# Patient Record
Sex: Male | Born: 1975 | Race: White | Marital: Married | Smoking: Current every day smoker
Health system: Southern US, Community
[De-identification: ages and names within clinical notes are randomized; demographics above are authoritative.]

## PROBLEM LIST (undated history)

## (undated) HISTORY — PX: BACK SURGERY: SHX140

## (undated) HISTORY — PX: FOOT SURGERY: SHX648

---

## 2015-01-26 ENCOUNTER — Encounter: Payer: Self-pay | Admitting: Emergency Medicine

## 2015-01-26 ENCOUNTER — Emergency Department: Payer: Commercial Managed Care - HMO

## 2015-01-26 ENCOUNTER — Inpatient Hospital Stay
Admission: EM | Admit: 2015-01-26 | Discharge: 2015-01-28 | DRG: 201 | Disposition: A | Discharge status: Home / Self Care | Payer: Commercial Managed Care - HMO | Attending: Surgery | Admitting: Surgery

## 2015-01-26 DIAGNOSIS — Z885 Allergy status to narcotic agent status: Secondary | ICD-10-CM

## 2015-01-26 DIAGNOSIS — J939 Pneumothorax, unspecified: Secondary | ICD-10-CM | POA: Diagnosis not present

## 2015-01-26 DIAGNOSIS — Z4682 Encounter for fitting and adjustment of non-vascular catheter: Secondary | ICD-10-CM

## 2015-01-26 DIAGNOSIS — F17210 Nicotine dependence, cigarettes, uncomplicated: Secondary | ICD-10-CM | POA: Diagnosis present

## 2015-01-26 DIAGNOSIS — Z9689 Presence of other specified functional implants: Secondary | ICD-10-CM

## 2015-01-26 DIAGNOSIS — R079 Chest pain, unspecified: Secondary | ICD-10-CM

## 2015-01-26 LAB — BASIC METABOLIC PANEL
ANION GAP: 10 (ref 5–15)
BUN: 9 mg/dL (ref 6–20)
CALCIUM: 9.9 mg/dL (ref 8.9–10.3)
CO2: 25 mmol/L (ref 22–32)
Chloride: 105 mmol/L (ref 101–111)
Creatinine, Ser: 0.84 mg/dL (ref 0.61–1.24)
Glucose, Bld: 97 mg/dL (ref 65–99)
Potassium: 3.8 mmol/L (ref 3.5–5.1)
Sodium: 140 mmol/L (ref 135–145)

## 2015-01-26 LAB — CBC
HCT: 47 % (ref 40.0–52.0)
HEMOGLOBIN: 15.9 g/dL (ref 13.0–18.0)
MCH: 30.8 pg (ref 26.0–34.0)
MCHC: 33.8 g/dL (ref 32.0–36.0)
MCV: 91.1 fL (ref 80.0–100.0)
Platelets: 252 10*3/uL (ref 150–440)
RBC: 5.16 MIL/uL (ref 4.40–5.90)
RDW: 13.3 % (ref 11.5–14.5)
WBC: 21.2 10*3/uL — AB (ref 3.8–10.6)

## 2015-01-26 LAB — TROPONIN I

## 2015-01-26 MED ORDER — DIPHENHYDRAMINE HCL 12.5 MG/5ML PO ELIX: 12.5000 mg | ORAL_SOLUTION | Freq: Four times a day (QID) | ORAL | Status: DC | PRN

## 2015-01-26 MED ORDER — HYDROMORPHONE HCL 1 MG/ML IJ SOLN
0.5000 mg | Freq: Once | INTRAMUSCULAR | Status: AC
  Administered 2015-01-26: 0.5 mg via INTRAVENOUS
  Filled 2015-01-26: qty 1

## 2015-01-26 MED ORDER — ETOMIDATE 2 MG/ML IV SOLN
INTRAVENOUS | Status: AC | PRN
  Administered 2015-01-26 (×2): 5 mg via INTRAVENOUS
  Administered 2015-01-26: 10 mg via INTRAVENOUS

## 2015-01-26 MED ORDER — OXYCODONE HCL 5 MG PO TABS
5.0000 mg | ORAL_TABLET | ORAL | Status: DC | PRN
  Administered 2015-01-26 – 2015-01-28 (×7): 5 mg via ORAL
  Filled 2015-01-26 (×9): qty 1

## 2015-01-26 MED ORDER — ONDANSETRON HCL 4 MG/2ML IJ SOLN
4.0000 mg | Freq: Four times a day (QID) | INTRAMUSCULAR | Status: DC | PRN
  Administered 2015-01-26: 4 mg via INTRAVENOUS
  Filled 2015-01-26: qty 2

## 2015-01-26 MED ORDER — ETOMIDATE 2 MG/ML IV SOLN
INTRAVENOUS | Status: AC
  Filled 2015-01-26: qty 10

## 2015-01-26 MED ORDER — HYDROMORPHONE HCL 1 MG/ML IJ SOLN
0.5000 mg | Freq: Once | INTRAMUSCULAR | Status: AC
  Administered 2015-01-26: 0.5 mg via INTRAVENOUS

## 2015-01-26 MED ORDER — ONDANSETRON HCL 4 MG PO TABS
4.0000 mg | ORAL_TABLET | Freq: Four times a day (QID) | ORAL | Status: DC | PRN
  Administered 2015-01-28: 4 mg via ORAL
  Filled 2015-01-26: qty 1

## 2015-01-26 MED ORDER — SODIUM CHLORIDE 0.9 % IJ SOLN: 9.0000 mL | INTRAMUSCULAR | Status: DC | PRN

## 2015-01-26 MED ORDER — HYDROMORPHONE HCL 1 MG/ML IJ SOLN
INTRAMUSCULAR | Status: AC | PRN
  Administered 2015-01-26: 1 mg via INTRAVENOUS

## 2015-01-26 MED ORDER — LORAZEPAM 2 MG/ML IJ SOLN
0.5000 mg | INTRAMUSCULAR | Status: DC | PRN
  Administered 2015-01-26 – 2015-01-28 (×7): 0.5 mg via INTRAVENOUS
  Filled 2015-01-26 (×8): qty 1

## 2015-01-26 MED ORDER — PROMETHAZINE HCL 25 MG PO TABS
25.0000 mg | ORAL_TABLET | Freq: Four times a day (QID) | ORAL | Status: DC | PRN
  Administered 2015-01-27: 25 mg via ORAL
  Filled 2015-01-26: qty 1

## 2015-01-26 MED ORDER — SODIUM CHLORIDE 0.9 % IJ SOLN: 3.0000 mL | INTRAMUSCULAR | Status: DC | PRN

## 2015-01-26 MED ORDER — HYDROMORPHONE HCL 1 MG/ML IJ SOLN
1.0000 mg | Freq: Once | INTRAMUSCULAR | Status: AC
  Administered 2015-01-26: 1 mg via INTRAVENOUS
  Filled 2015-01-26: qty 1

## 2015-01-26 MED ORDER — DIPHENHYDRAMINE HCL 50 MG/ML IJ SOLN
12.5000 mg | Freq: Four times a day (QID) | INTRAMUSCULAR | Status: DC | PRN
  Administered 2015-01-26: 12.5 mg via INTRAVENOUS
  Filled 2015-01-26: qty 1

## 2015-01-26 MED ORDER — ONDANSETRON HCL 4 MG/2ML IJ SOLN: 4.0000 mg | Freq: Four times a day (QID) | INTRAMUSCULAR | Status: DC | PRN

## 2015-01-26 MED ORDER — LIDOCAINE-EPINEPHRINE (PF) 1 %-1:200000 IJ SOLN
INTRAMUSCULAR | Status: AC
  Filled 2015-01-26: qty 30

## 2015-01-26 MED ORDER — ONDANSETRON HCL 4 MG/2ML IJ SOLN
4.0000 mg | Freq: Once | INTRAMUSCULAR | Status: AC
  Administered 2015-01-26: 4 mg via INTRAVENOUS
  Filled 2015-01-26: qty 2

## 2015-01-26 MED ORDER — HYDROMORPHONE HCL 1 MG/ML IJ SOLN
1.0000 mg | INTRAMUSCULAR | Status: DC | PRN
Start: 2015-01-26 — End: 2015-01-26
  Administered 2015-01-26 (×2): 1 mg via INTRAVENOUS
  Filled 2015-01-26 (×3): qty 1

## 2015-01-26 MED ORDER — HYDROMORPHONE HCL 1 MG/ML IJ SOLN
INTRAMUSCULAR | Status: AC
  Filled 2015-01-26: qty 1

## 2015-01-26 MED ORDER — SODIUM CHLORIDE 0.9 % IJ SOLN
3.0000 mL | Freq: Two times a day (BID) | INTRAMUSCULAR | Status: DC
  Administered 2015-01-28: 3 mL via INTRAVENOUS

## 2015-01-26 MED ORDER — HYDROMORPHONE 0.3 MG/ML IV SOLN
INTRAVENOUS | Status: DC
  Administered 2015-01-26: 21:00:00 via INTRAVENOUS
  Administered 2015-01-27: 1.8 mg via INTRAVENOUS
  Administered 2015-01-27: 09:00:00 via INTRAVENOUS
  Administered 2015-01-27: 1.5 mg via INTRAVENOUS
  Administered 2015-01-27: 2.4 mg via INTRAVENOUS
  Administered 2015-01-28: 2.1 mg via INTRAVENOUS
  Filled 2015-01-26 (×3): qty 25

## 2015-01-26 MED ORDER — LORAZEPAM 2 MG/ML IJ SOLN
INTRAMUSCULAR | Status: AC
  Filled 2015-01-26: qty 1

## 2015-01-26 MED ORDER — NALOXONE HCL 0.4 MG/ML IJ SOLN: 0.4000 mg | INTRAMUSCULAR | Status: DC | PRN

## 2015-01-26 MED ORDER — LORAZEPAM 2 MG/ML IJ SOLN
0.5000 mg | INTRAMUSCULAR | Status: DC
  Administered 2015-01-26: 0.5 mg via INTRAVENOUS

## 2015-01-26 MED ORDER — SODIUM CHLORIDE 0.9 % IV SOLN: 250.0000 mL | INTRAVENOUS | Status: DC | PRN

## 2015-01-26 NOTE — ED Notes (Signed)
Dr cooper at bedside to advance chest tube. Pt continues to co pain. pcxr for placement

## 2015-01-26 NOTE — Progress Notes (Signed)
Dr. Excell Seltzer was contacted via telephone to notify him that the patient was still having significant pain.  An order for dilaudid 0.5mg  was given and medication was administered.  Dr. Excell Seltzer was also contacted and notified that Mr. Cameron Mclaughlin was requesting something to help him relax, as he was very anxious.  An order for ativan 0.5mg  was given via telephone and administered.  Patient is now resting quietly.

## 2015-01-26 NOTE — ED Notes (Signed)
Trachea midline. Pt speaking in complete sentences.

## 2015-01-26 NOTE — ED Provider Notes (Signed)
During chest tube insertion, I performed sedation of the patient with etomidate. Patient required several doses of etomidate to achieve adequate sedation. Initially he was given 10 mg infusion followed by 2 separate 5 mg doses. This supplied adequate sedation for chest tube placement by Dr. Shaune Pollack. I was at the bedside less than 10 minutes for the procedure.  Emily Filbert, MD 01/26/15 [phone]

## 2015-01-26 NOTE — Progress Notes (Signed)
Postprocedure film reviewed personally report is not yet dictated. Chest tube is been advanced appropriately and the lung appears near completely expanded.

## 2015-01-26 NOTE — H&P (Signed)
Cameron Mclaughlin is an 39 y.o. male.    Chief Complaint: Right pneumothorax  HPI: The onset of right chest pain with shortness of breath. Never had an episode like this to before denies hemoptysis or nausea or vomiting A chest tube was placed by Dr. Reita Cliche in the ED and a post procedure film demonstrated the need for advancing the chest tube slightly. Performed by me in the ER. No trauma history  No past medical history on file.  Past Surgical History  Procedure Laterality Date  . Back surgery      No family history on file. Social History:  reports that he has been smoking Cigarettes.  He has been smoking about 1.00 pack per day. He does not have any smokeless tobacco history on file. He reports that he drinks alcohol. His drug history is not on file.  Allergies:  Allergies  Allergen Reactions  . Morphine And Related Hives     (Not in a hospital admission)   Review of Systems  Constitutional: Negative.   HENT: Negative.   Eyes: Negative.   Respiratory: Positive for shortness of breath. Negative for cough, hemoptysis, sputum production and wheezing.   Cardiovascular: Positive for chest pain. Negative for palpitations and orthopnea.  Gastrointestinal: Negative for heartburn and nausea.  Genitourinary: Negative.   Musculoskeletal: Negative.   Skin: Negative.   Neurological: Negative.   Psychiatric/Behavioral: Negative.      Physical Exam:  BP 154/97 mmHg  Pulse 83  Resp 30  Ht 5' 9"  (1.753 m)  Wt 160 lb (72.576 kg)  BMI 23.62 kg/m2  SpO2 97%  Physical Exam  Constitutional: He is oriented to person, place, and time and well-developed, well-nourished, and in no distress.  Right chest tube in place patient appears quite uncomfortable and it prefers to sit straight  HENT:  Head: Normocephalic and atraumatic.  Eyes: No scleral icterus.  Neck: Normal range of motion.  Cardiovascular: Normal rate and regular rhythm.   Pulmonary/Chest: No respiratory distress. He has  wheezes. He has no rales.  Right chest tube in place no air leak when on waterseal  Abdominal: Soft. He exhibits no distension.  Musculoskeletal: He exhibits no edema.  Lymphadenopathy:    He has no cervical adenopathy.  Neurological: He is alert and oriented to person, place, and time.  Skin: Skin is warm and dry.  Psychiatric: Affect normal.        Results for orders placed or performed during the hospital encounter of 01/26/15 (from the past 48 hour(s))  Basic metabolic panel     Status: None   Collection Time: 01/26/15  2:26 PM  Result Value Ref Range   Sodium 140 135 - 145 mmol/L   Potassium 3.8 3.5 - 5.1 mmol/L   Chloride 105 101 - 111 mmol/L   CO2 25 22 - 32 mmol/L   Glucose, Bld 97 65 - 99 mg/dL   BUN 9 6 - 20 mg/dL   Creatinine, Ser 0.84 0.61 - 1.24 mg/dL   Calcium 9.9 8.9 - 10.3 mg/dL   GFR calc non Af Amer >60 >60 mL/min   GFR calc Af Amer >60 >60 mL/min    Comment: (NOTE) The eGFR has been calculated using the CKD EPI equation. This calculation has not been validated in all clinical situations. eGFR's persistently <60 mL/min signify possible Chronic Kidney Disease.    Anion gap 10 5 - 15  CBC     Status: Abnormal   Collection Time: 01/26/15  2:26 PM  Result  Value Ref Range   WBC 21.2 (H) 3.8 - 10.6 K/uL   RBC 5.16 4.40 - 5.90 MIL/uL   Hemoglobin 15.9 13.0 - 18.0 g/dL   HCT 47.0 40.0 - 52.0 %   MCV 91.1 80.0 - 100.0 fL   MCH 30.8 26.0 - 34.0 pg   MCHC 33.8 32.0 - 36.0 g/dL   RDW 13.3 11.5 - 14.5 %   Platelets 252 150 - 440 K/uL  Troponin I     Status: None   Collection Time: 01/26/15  2:26 PM  Result Value Ref Range   Troponin I <0.03 <0.031 ng/mL    Comment:        NO INDICATION OF MYOCARDIAL INJURY.    Dg Chest 1 View  01/26/2015   CLINICAL DATA:  Chest tube insertion for pneumothorax  EXAM: CHEST 1 VIEW  COMPARISON:  Study obtained earlier in the day  FINDINGS: There is now a chest tube on the right. The tip of the chest tube appears to reside  in the pleural space. The side port is in the lateral soft tissues. There is extensive soft tissue air on the right. There is now all only a fairly small apical and lateral pneumothorax. There is an area of focal opacity in the right mid lung which may represent localized hematoma. Lungs elsewhere clear. Heart size and pulmonary vascularity are normal. No adenopathy. No bone lesions.  IMPRESSION: There is only a small residual pneumothorax. Note that the side port of the chest tube is outside of the pleural space with soft tissue air present on the right laterally. There is confluent opacity in the mid right lung which likely represents hematoma. Lungs elsewhere clear.  These results were called by telephone at the time of interpretation on 01/26/2015 at 3:57 pm to Dr. Lisa Roca, who verbally acknowledged these results.   Electronically Signed   By: Lowella Grip III M.D.   On: 01/26/2015 15:58   Dg Chest Port 1 View  01/26/2015   CLINICAL DATA:  Chest pain of acute onset  EXAM: PORTABLE CHEST 1 VIEW  COMPARISON:  None.  FINDINGS: There is a virtually complete pneumothorax on the right without appreciable tension component. Left lung clear. Heart size and pulmonary vascularity are normal. No adenopathy. No bone lesions.  IMPRESSION: Nearly complete pneumothorax on the right without tension component. Left lung clear.  Critical Value/emergent results were called by telephone at the time of interpretation on 01/26/2015 at 2:46 pm to Dr. Harvest Dark , who verbally acknowledged these results.   Electronically Signed   By: Lowella Grip III M.D.   On: 01/26/2015 14:47     Assessment/Plan  Chest films are personally reviewed. This tube is advanced an inch and a half roughly. Postprocedure chest film is pending. On today's pneumothorax or recommend admission to the hospital with chest tube on suction and repeat films in the morning I will last Dr. Marta Lamas to see the patient as well.  Florene Glen, MD, FACS

## 2015-01-26 NOTE — Progress Notes (Signed)
Chest tube placement reviewed with radiology and the films were personally reviewed and discussed with Dr. Shaune Pollack. Agree with advancing the tube and inch and a half or so. Lysing aseptic technique and no additional anesthesia the dressing was taken down sutures were cut tube was advanced an inch and a half and sutures were replaced and dressing was replaced. Patient tolerated this procedure well and the chest film is currently pending.

## 2015-01-26 NOTE — Progress Notes (Signed)
Spoke with Dr. Egbert Garibaldi via phone about patient yelling out in pain and verbalizing feeling very anxious. Patient also wanted something for nausea but is not time for more nausea medication. Dr.Bird verbalized that he would put in some new orders for pain medication. Anselm Jungling

## 2015-01-26 NOTE — ED Provider Notes (Signed)
[facility] Emergency Department Provider Note   ____________________________________________  Time seen: 2:20 PM I have reviewed the triage vital signs and the triage nursing note.  HISTORY  Chief Complaint Chest Pain   Historian Limited history as patient is in significant pain and discomfort  wife and mother-in-law provide additional history.  HPI Cameron Mclaughlin is a 39 y.o. male who is no significant past medical history other than he is a smoker, who around 3 AM started to develop a little bit of right sided chest pain which is sharp with some mild shortness of breath. This progressed over several hours and approximately 45 minutes prior to arrival he started have severe right-sided chest pain preventing him from taking a deep breath, and worse with deep breaths which initiated coughing spell/episode. No history of asthma or use of inhalers. Never had an episode like this before. No overuse or injury history. Not recently ill.    No past medical history on file. none  There are no active problems to display for this patient.   Past Surgical History  Procedure Laterality Date  . Back surgery      No current outpatient prescriptions on file.  Allergies Morphine and related  No family history on file.  Social History Social History  Substance Use Topics  . Smoking status: Current Every Day Smoker -- 1.00 packs/day    Types: Cigarettes  . Smokeless tobacco: None  . Alcohol Use: Yes     Comment: occas    Review of Systems Limited due to significant pain and trouble breathing. Constitutional: Negative for fever. Eyes:  ENT:  Cardiovascular: Negative for palpitations. Respiratory: Negative for wheezing Gastrointestinal: Questionable upper abdominal pain Genitourinary:  Musculoskeletal:  Skin:  Neurological:  10 point Review of Systems otherwise negative ____________________________________________   PHYSICAL EXAM:  VITAL  SIGNS: ED Triage Vitals  Enc Vitals Group     BP --      Pulse --      Resp --      Temp --      Temp src --      SpO2 --      Weight 01/26/15 1410 160 lb (72.576 kg)     Height 01/26/15 1410 5' 9" (1.753 m)     Head Cir --      Peak Flow --      Pain Score 01/26/15 1410 10     Pain Loc --      Pain Edu? --      Excl. in [place]? --      Constitutional: Alert and oriented. Well appearing and in no distress. Eyes: Conjunctivae are normal. PERRL. Normal extraocular movements. ENT   Head: Normocephalic and atraumatic.   Nose: No congestion/rhinnorhea.   Mouth/Throat: Mucous membranes are moist.   Neck: No stridor. Cardiovascular/Chest: Normal rate, regular rhythm.  No murmurs, rubs, or gallops. Respiratory: Mild decreased breath sounds right posterior lung field. No Wheezing. No rhonchi. Gastrointestinal: Soft. No distention, no guarding, no rebound. Nontender   Genitourinary/rectal:Deferred Musculoskeletal: Nontender with normal range of motion in all extremities. No joint effusions.  No lower extremity tenderness.  No edema. Neurologic:  Normal speech and language. No gross or focal neurologic deficits are appreciated. Skin:  Skin is warm, dry and intact. No rash noted. Psychiatric: Mood and affect are normal. Speech and behavior are normal. Patient exhibits appropriate insight and judgment.  ____________________________________________   EKG I, Lisa Roca, MD, the attending physician have personally viewed and interpreted all  ECGs.  89 beats a minute. Normal sinus rhythm. Narrow QRS. Normal axis. Normal ST and T-wave. ____________________________________________  LABS (pertinent positives/negatives)   Basic metabolic panel within normal limits White blood count 21.2, hemoglobin 15.9, platelet count 252 Troponin less than 0.03  ____________________________________________  RADIOLOGY All Xrays were viewed by me. Imaging interpreted by  Radiologist.  Chest x-ray portable: IMPRESSION: Nearly complete pneumothorax on the right without tension component. Left lung clear.  Critical Value/emergent results were called by telephone at the time of interpretation on 01/26/2015 at 2:46 pm to Dr. Harvest Dark , who verbally acknowledged these results.   Chest x-ray portable:  FINDINGS: There is now a chest tube on the right. The tip of the chest tube appears to reside in the pleural space. The side port is in the lateral soft tissues. There is extensive soft tissue air on the right. There is now all only a fairly small apical and lateral pneumothorax. There is an area of focal opacity in the right mid lung which may represent localized hematoma. Lungs elsewhere clear. Heart size and pulmonary vascularity are normal. No adenopathy. No bone lesions.  IMPRESSION: There is only a small residual pneumothorax. Note that the side port of the chest tube is outside of the pleural space with soft tissue air present on the right laterally. There is confluent opacity in the mid right lung which likely represents hematoma. Lungs elsewhere clear. __________________________________________  PROCEDURES  Procedure(s) performed: CHEST TUBE INSERTION Date/Time: 01/26/2015 at 4:21 PM Performed by: Lisa Roca Consent: The procedure was performed in an emergent situation. Imaging studies: imaging studies available Required items: required blood products, implants, devices, and special equipment available Patient identity confirmed: arm band and available demographic data Time out: Immediately prior to procedure a "time out" was called to verify the correct patient, procedure, equipment, support staff and site/side marked as required.  Indications: Tension Pneumothorax  Patient sedated: yes Anesthesia: yes Preparation: skin prepped with chlorhexidine   Placement location: Right lateral  Scalpel size: 10 Tube size: 20  [place] Dissection instrument: finger and Kelly clamp  Some difficulty obtaining access to the pleural cavity due to the patient's room spinning extremely narrow/close together. Upon entering the pleural space there was a large rush of air. Upon placing the chest tube with a posterior superior approach, resistance was met in the patient seen to be in severe amount of pain. Given the patient is no longer under symptoms of tension pneumothorax, tube was stitched in place in order to obtain evaluation with the chest x-ray.   Tension pneumothorax heard: yes Tube connected to: water seal  Suture material: 0 silk Dressing: Nonadherent dressing covered by gauze with paper tape.   Post-insertion x-ray findings: Minimal residual pneumothorax with significant lung aeration improvement. Tube with side port in soft tissues.   Patient tolerance: Patient tolerated the procedure well with no immediate complications   MODERATE SEDATION: Performed by Dr. Jimmye Norman Indication: Procedural sedation for insertion of right chest tube Consent obtained from patient and wife Etomidate given initially 10 mg IV, and patient required 5 mg IV additional, and then a second 5 mg additional for appropriate sedation Mild muscle fasciculations, but no significant adverse reactions. No airway compromise or issues/complications. Patient maintained airway.      Critical Care performed: CRITICAL CARE Performed by: Lisa Roca   Total critical care time: 45 minutes  Critical care time was exclusive of separately billable procedures and treating other patients.  Critical care was necessary to treat or prevent  imminent or life-threatening deterioration.  Critical care was time spent personally by me on the following activities: development of treatment plan with patient and/or surrogate as well as nursing, discussions with consultants, evaluation of patient's response to treatment, examination of patient, obtaining  history from patient or surrogate, ordering and performing treatments and interventions, ordering and review of laboratory studies, ordering and review of radiographic studies, pulse oximetry and re-evaluation of patient's condition.   ____________________________________________   ED COURSE / ASSESSMENT AND PLAN  CONSULTATIONS: Phone consultation with general surgeon Dr. Burt Knack.  Pertinent labs & imaging results that were available during my care of the patient were reviewed by me and considered in my medical decision making (see chart for details).  Patient arrived in significant distress, although vital signs were stable. He was having short quick breaths and complaining of severe right-sided chest pain going through to his back. His O2 sat was between 9396%, and he is a smoker, and it was unclear whether or not this may be his baseline, or whether or not this may be slight hypoxia due to my first clinical suspicion of spontaneous pneumothorax.  EKG was normal. Labs were sent to the lab. Portal chest x-ray was obtained and showed large pneumothorax with slight shift of the airway to my view. Patient is also severely uncomfortable, is concerned about the possibility of tension component. I consulted general surgery and Dr. Burt Knack, who was unavailable for chest tube placement due to operating room. He would be available for consultation and admitting the patient in several hours.  I discussed with the patient the necessity of placing the chest tube now due to concern for early tension pneumothorax. I obtained consent for both conscious sedation for the procedure as well as the procedure chest tube placement. Dr. Jimmye Norman performed the moderate sedation, while I placed the chest tube.  Chest tube did meet some resistance in attempts to advance posteriorly and superiorly. Chest x-ray confirmed tube not advanced enough, with side ports in the soft tissues. However patient is currently comfortable  with the pneumothorax all but resolved on chest x-ray.   Dr. Burt Knack at bedside to advance chest tube and admit patient.  Patient / Family / Caregiver informed of clinical course, medical decision-making process, and agree with plan.    ___________________________________________   FINAL CLINICAL IMPRESSION(S) / ED DIAGNOSES   Final diagnoses:  Chest pain  Pneumothorax       Lisa Roca, MD 01/26/15 1644

## 2015-01-26 NOTE — ED Notes (Signed)
Pt here with c/o midsternal cp that began this am while he was washing his car, pt states he has never felt pain like this before, coughing in triage, states he is having a very hard time breathing. Sats 93-96% RA. Denies any cardiac history. States his right arm is "weak" also.

## 2015-01-26 NOTE — ED Notes (Signed)
Pt informed of procedure.

## 2015-01-27 ENCOUNTER — Observation Stay: Payer: Commercial Managed Care - HMO

## 2015-01-27 DIAGNOSIS — J939 Pneumothorax, unspecified: Secondary | ICD-10-CM | POA: Diagnosis present

## 2015-01-27 DIAGNOSIS — F17210 Nicotine dependence, cigarettes, uncomplicated: Secondary | ICD-10-CM | POA: Diagnosis present

## 2015-01-27 DIAGNOSIS — Z885 Allergy status to narcotic agent status: Secondary | ICD-10-CM | POA: Diagnosis not present

## 2015-01-27 LAB — CBC WITH DIFFERENTIAL/PLATELET
BASOS PCT: 0 %
Basophils Absolute: 0 10*3/uL (ref 0–0.1)
EOS ABS: 0.2 10*3/uL (ref 0–0.7)
Eosinophils Relative: 2 %
HCT: 43.9 % (ref 40.0–52.0)
Hemoglobin: 14.9 g/dL (ref 13.0–18.0)
Lymphocytes Relative: 31 %
Lymphs Abs: 3.2 10*3/uL (ref 1.0–3.6)
MCH: 31.4 pg (ref 26.0–34.0)
MCHC: 34 g/dL (ref 32.0–36.0)
MCV: 92.4 fL (ref 80.0–100.0)
MONO ABS: 1.1 10*3/uL — AB (ref 0.2–1.0)
MONOS PCT: 11 %
Neutro Abs: 5.8 10*3/uL (ref 1.4–6.5)
Neutrophils Relative %: 56 %
PLATELETS: 196 10*3/uL (ref 150–440)
RBC: 4.75 MIL/uL (ref 4.40–5.90)
RDW: 13.2 % (ref 11.5–14.5)
WBC: 10.4 10*3/uL (ref 3.8–10.6)

## 2015-01-27 NOTE — Progress Notes (Signed)
Family is concerned about elevated WBC. MD called about WBC to see if any action was warranted but MD did not want to order anything. Will continue to assess.

## 2015-01-27 NOTE — Progress Notes (Signed)
Cameron Mclaughlin Inpatient Post-Op Note  Patient ID: Cameron Mclaughlin, male   DOB: [birth year], 39 y.o.   MRN: [id number]  HISTORY: Pain persists at chest tube site but better.  No air leak on poor coughing effort.  Appetite is adequate.  No fever.   Filed Vitals:   01/27/15 1234  BP:   Pulse:   Temp:   Resp: 20     EXAM: Resp: Lungs are clear bilaterally.  No respiratory distress, normal effort. Heart:  Regular without murmurs Neurological: Alert and oriented to person, place, and time. Coordination normal.  Skin: Skin is warm and dry. No rash noted. No diaphoretic. No erythema. No pallor. No palpable subcutaneous air.    ASSESSMENT: Independent review of CXRay from this morning is without pneumothorax.  No air leak.   PLAN:   Will leave on suction today Plan to repeat CXRay in the morning and possibly remove tube    Hulda Marin, MD

## 2015-01-28 ENCOUNTER — Inpatient Hospital Stay: Payer: Commercial Managed Care - HMO

## 2015-01-28 MED ORDER — OXYCODONE HCL 5 MG PO TABS: 5.0000 mg | ORAL_TABLET | ORAL | Status: AC | PRN

## 2015-01-28 MED ORDER — PROMETHAZINE HCL 25 MG PO TABS: 25.0000 mg | ORAL_TABLET | Freq: Four times a day (QID) | ORAL | Status: AC | PRN

## 2015-01-28 NOTE — Discharge Summary (Signed)
Physician Discharge Summary  Patient ID: Kerolos Nehme MRN: [id number] DOB/AGE: [birth year] 38 y.o.  Admit date: 01/26/2015 Discharge date: 01/28/2015   Discharge Diagnoses:  Active Problems:   Pneumothorax on right   Procedures:chest tube insertion on right   Hospital Course: Did well.  Lung fully expanded.  Chest tube removed and post pull Chest Xray looked good.  Disposition: Final discharge disposition not confirmed  Discharge Instructions    Diet - low sodium heart healthy    Complete by:  As directed      Discharge instructions    Complete by:  As directed   Leave dressing in place until Thursday in clinic.     Increase activity slowly    Complete by:  As directed             Medication List    TAKE these medications        cetirizine 10 MG tablet  Commonly known as:  ZYRTEC  Take 10 mg by mouth daily.     fluticasone 50 MCG/ACT nasal spray  Commonly known as:  FLONASE  Place 2 sprays into both nostrils daily.     oxyCODONE 5 MG immediate release tablet  Commonly known as:  Oxy IR/ROXICODONE  Take 1 tablet (5 mg total) by mouth every 4 (four) hours as needed for moderate pain.     promethazine 25 MG tablet  Commonly known as:  PHENERGAN  Take 1 tablet (25 mg total) by mouth every 6 (six) hours as needed for nausea or refractory nausea / vomiting.           Follow-up Information    Follow up with Hulda Marin, MD In 2 days.   Specialty:  Cardiothoracic Surgery   Why:  As needed   Contact information:   [address] [place] [address] [phone]       Hulda Marin, MD

## 2015-01-28 NOTE — Progress Notes (Signed)
SPOKE with Dr.Oaks regarding his dc orders of Pca. Informed that pt has not been below a 8-10 on pain scale. Orders to continue with dc.

## 2015-01-28 NOTE — Progress Notes (Signed)
More awake and alert today.  Appetite is marginal and pain is controlled with oral Dilaudid.  Wounds are clean and dry.  No erythema.    Independent review of CXRay shows miniscule right apical pneumothorax on water seal.  Linear atelectasis persists  WBC normal today.  There is no air leak with vigorous cough  Chest tube removed without incident  Will repeat CXray now.  Discharge instructions given.  Will see on Thursday for repeat CXray.  [employer].

## 2015-01-30 ENCOUNTER — Ambulatory Visit
Admission: RE | Admit: 2015-01-30 | Discharge: 2015-01-30 | Disposition: A | Discharge status: Home / Self Care | Payer: Commercial Managed Care - HMO | Source: Ambulatory Visit | Attending: Cardiothoracic Surgery | Admitting: Cardiothoracic Surgery

## 2015-01-30 ENCOUNTER — Inpatient Hospital Stay: Payer: Commercial Managed Care - HMO | Attending: Cardiothoracic Surgery | Admitting: Cardiothoracic Surgery

## 2015-01-30 VITALS — BP 124/86 | HR 59 | Temp 96.9°F | Ht 68.0 in | Wt 153.4 lb

## 2015-01-30 DIAGNOSIS — J9811 Atelectasis: Secondary | ICD-10-CM | POA: Insufficient documentation

## 2015-01-30 DIAGNOSIS — J90 Pleural effusion, not elsewhere classified: Secondary | ICD-10-CM | POA: Diagnosis not present

## 2015-01-30 DIAGNOSIS — J9383 Other pneumothorax: Secondary | ICD-10-CM

## 2015-01-30 DIAGNOSIS — R61 Generalized hyperhidrosis: Secondary | ICD-10-CM | POA: Diagnosis not present

## 2015-01-30 NOTE — Progress Notes (Signed)
Wrote return to work letter for patient.

## 2015-01-30 NOTE — Progress Notes (Signed)
Cameron Mclaughlin Inpatient Post-Op Note  Patient ID: Cameron Mclaughlin, male   DOB: Jul 08, 1975, 39 y.o.   MRN: [id number]  HISTORY: He returns today in follow-up. He states he feels much better now than when he was in the hospital. He is not short of breath. He does complain of some nighttime sweating feels that the room is been hot. He denies any known fever. No cough. Anus under good control.   Filed Vitals:   01/30/15 1332  BP: 124/86  Pulse: 59  Temp: 96.9 F (36.1 C)     EXAM: Resp: Lungs are clear bilaterally.  No respiratory distress, normal effort. Heart:  Regular without murmurs The right chest tube insertion site is clean and dry. Neurological: Alert and oriented to person, place, and time. Coordination normal.  Skin: Skin is warm and dry. No rash noted. No diaphoretic. No erythema. No pallor.  Psychiatric: Normal mood and affect. Normal behavior. Judgment and thought content normal.    ASSESSMENT: Right sided spontaneous pneumothorax with healed chest tube insertion site.   PLAN:   We will remove the sutures. I will obtain another chest x-ray is my independent review the film today shows continued atelectasis in the right midlung zone. I will see him back after that x-rays obtained. I discussed with him the long-term consequences of spontaneous pneumothorax. A 10% risk of recurrence was discussed.    Hulda Marin, MD

## 2015-01-30 NOTE — Progress Notes (Signed)
Patient here for follow up. Sutures removed. Steri applied patient education regarding wound care completed.

## 2015-02-27 ENCOUNTER — Inpatient Hospital Stay: Payer: Commercial Managed Care - HMO | Attending: Cardiothoracic Surgery | Admitting: Cardiothoracic Surgery

## 2015-02-27 ENCOUNTER — Encounter: Payer: Self-pay | Admitting: Cardiothoracic Surgery

## 2015-02-27 ENCOUNTER — Ambulatory Visit
Admission: RE | Admit: 2015-02-27 | Discharge: 2015-02-27 | Disposition: A | Discharge status: Home / Self Care | Payer: Commercial Managed Care - HMO | Source: Ambulatory Visit | Attending: Cardiothoracic Surgery | Admitting: Cardiothoracic Surgery

## 2015-02-27 VITALS — BP 120/73 | HR 65 | Resp 18 | Ht 69.0 in | Wt 152.6 lb

## 2015-02-27 DIAGNOSIS — Z09 Encounter for follow-up examination after completed treatment for conditions other than malignant neoplasm: Secondary | ICD-10-CM | POA: Insufficient documentation

## 2015-02-27 DIAGNOSIS — F17210 Nicotine dependence, cigarettes, uncomplicated: Secondary | ICD-10-CM | POA: Insufficient documentation

## 2015-02-27 DIAGNOSIS — F17200 Nicotine dependence, unspecified, uncomplicated: Secondary | ICD-10-CM | POA: Diagnosis not present

## 2015-02-27 DIAGNOSIS — J9383 Other pneumothorax: Secondary | ICD-10-CM | POA: Diagnosis not present

## 2015-02-27 DIAGNOSIS — Z23 Encounter for immunization: Secondary | ICD-10-CM | POA: Insufficient documentation

## 2015-02-27 MED ORDER — INFLUENZA VAC SPLIT QUAD 0.5 ML IM SUSY
0.5000 mL | PREFILLED_SYRINGE | Freq: Once | INTRAMUSCULAR | Status: AC
  Administered 2015-02-27: 0.5 mL via INTRAMUSCULAR

## 2015-02-27 NOTE — Progress Notes (Signed)
Patient here for follow up pneumothorax. Denis any SOB or pain today. States he has had some sharp pains on right side of chest occasionally.

## 2015-02-27 NOTE — Progress Notes (Signed)
[facility] Post-Op Note  Patient ID: Miki KinsJason Skilton, male   DOB: 09/23/1975, 39 y.o.   MRN: [id number]  HISTORY: Mr. Andrey CampanileWilson returns today in follow-up. He approximate one month ago he experienced a spontaneous right-sided pneumothorax which is managed with chest tube insertion and drainage. He comes in today without any specific complaints. He does not have any significant shortness of breath. He has only occasional twinges of pain at the chest tube insertion site. Unfortunately does continue to smoke but states he's trying to cut back.   Filed Vitals:   02/27/15 1026  BP: 120/73  Pulse: 65  Resp: 18     EXAM: Resp: Lungs are clear bilaterally.  No respiratory distress, normal effort. Heart:  Regular without murmurs Abd:  Abdomen is soft, non distended and non tender. No masses are palpable.  There is no rebound and no guarding.  Neurological: Alert and oriented to person, place, and time. Coordination normal.  Skin: Skin is warm and dry. No rash noted. No diaphoretic. No erythema. No pallor.  Psychiatric: Normal mood and affect. Normal behavior. Judgment and thought content normal.    ASSESSMENT: He did have a chest x-ray today which have independently reviewed. I see no evidence of a pneumothorax on this. There is no pleural effusion. All of his wounds are clean dry and intact. We again counseled him regarding smoking cessation.   PLAN:   We did not make a return visit for him today but would be happy to see him should the need arise. He understands that there is a 10% risk of recurrence. He was instructed to return to medical attention promptly if he should experience any the symptoms.  We offered him a flu vaccination today which she accepted. Our nurse administered this in the office. Patient stated he had no prior problems with flu vaccinations and had no allergies or previous history of Guyon Barr syndrome.    Hulda Marinimothy Yonatan Guitron, MD

## 2016-03-26 IMAGING — CR DG CHEST 2V
1 series · 2 of 2 positions shown · non-contrast
Comparison: PA and lateral chest x-ray January 30, 2015.

CLINICAL DATA: Right-sided pneumothorax 1 month ago, currently
asymptomatic, current smoker.

EXAM:
CHEST  2 VIEW

[Series 3: w chest pa · 0.14mm/px · 2 of 2 slices shown]
[im 1/2]
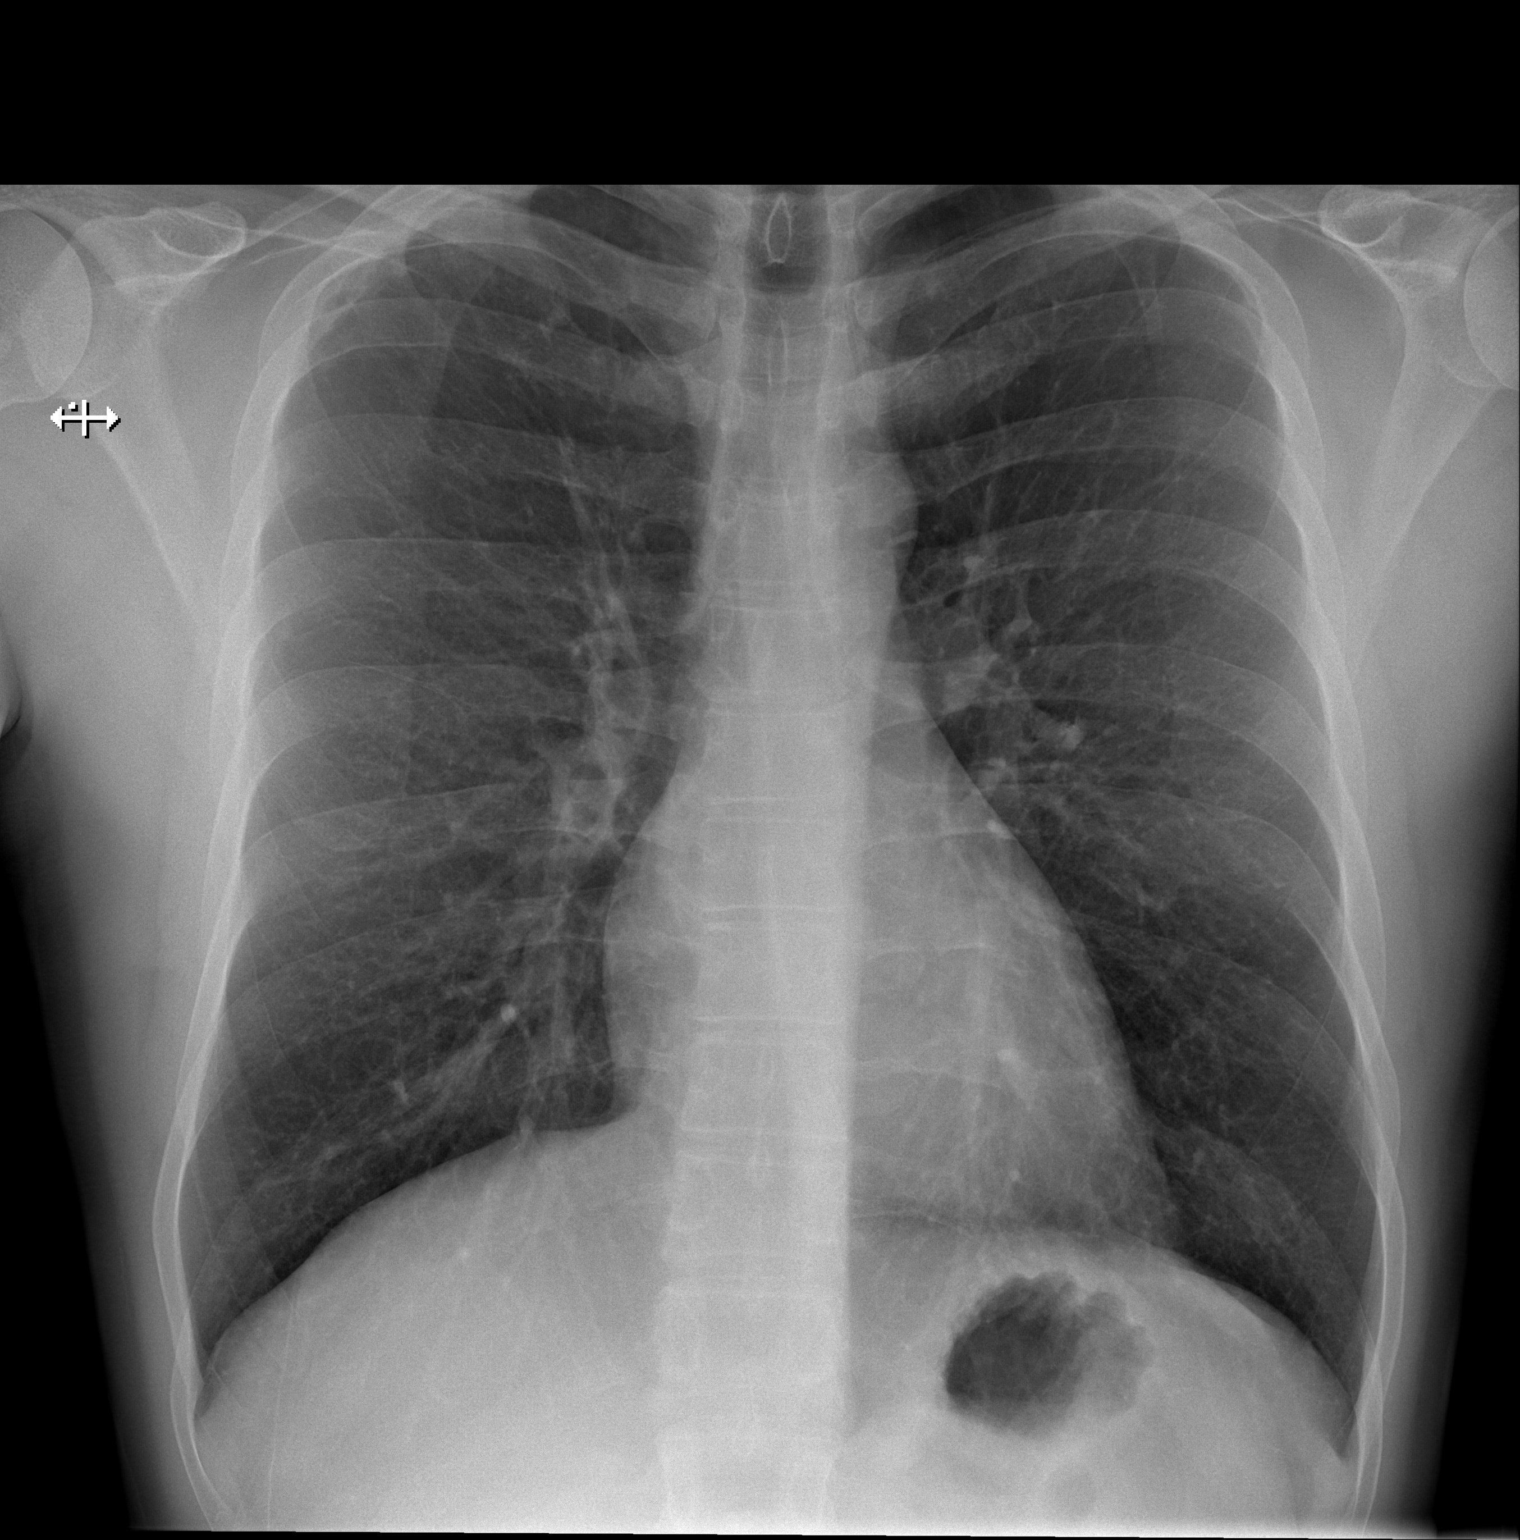
[im 2/2]
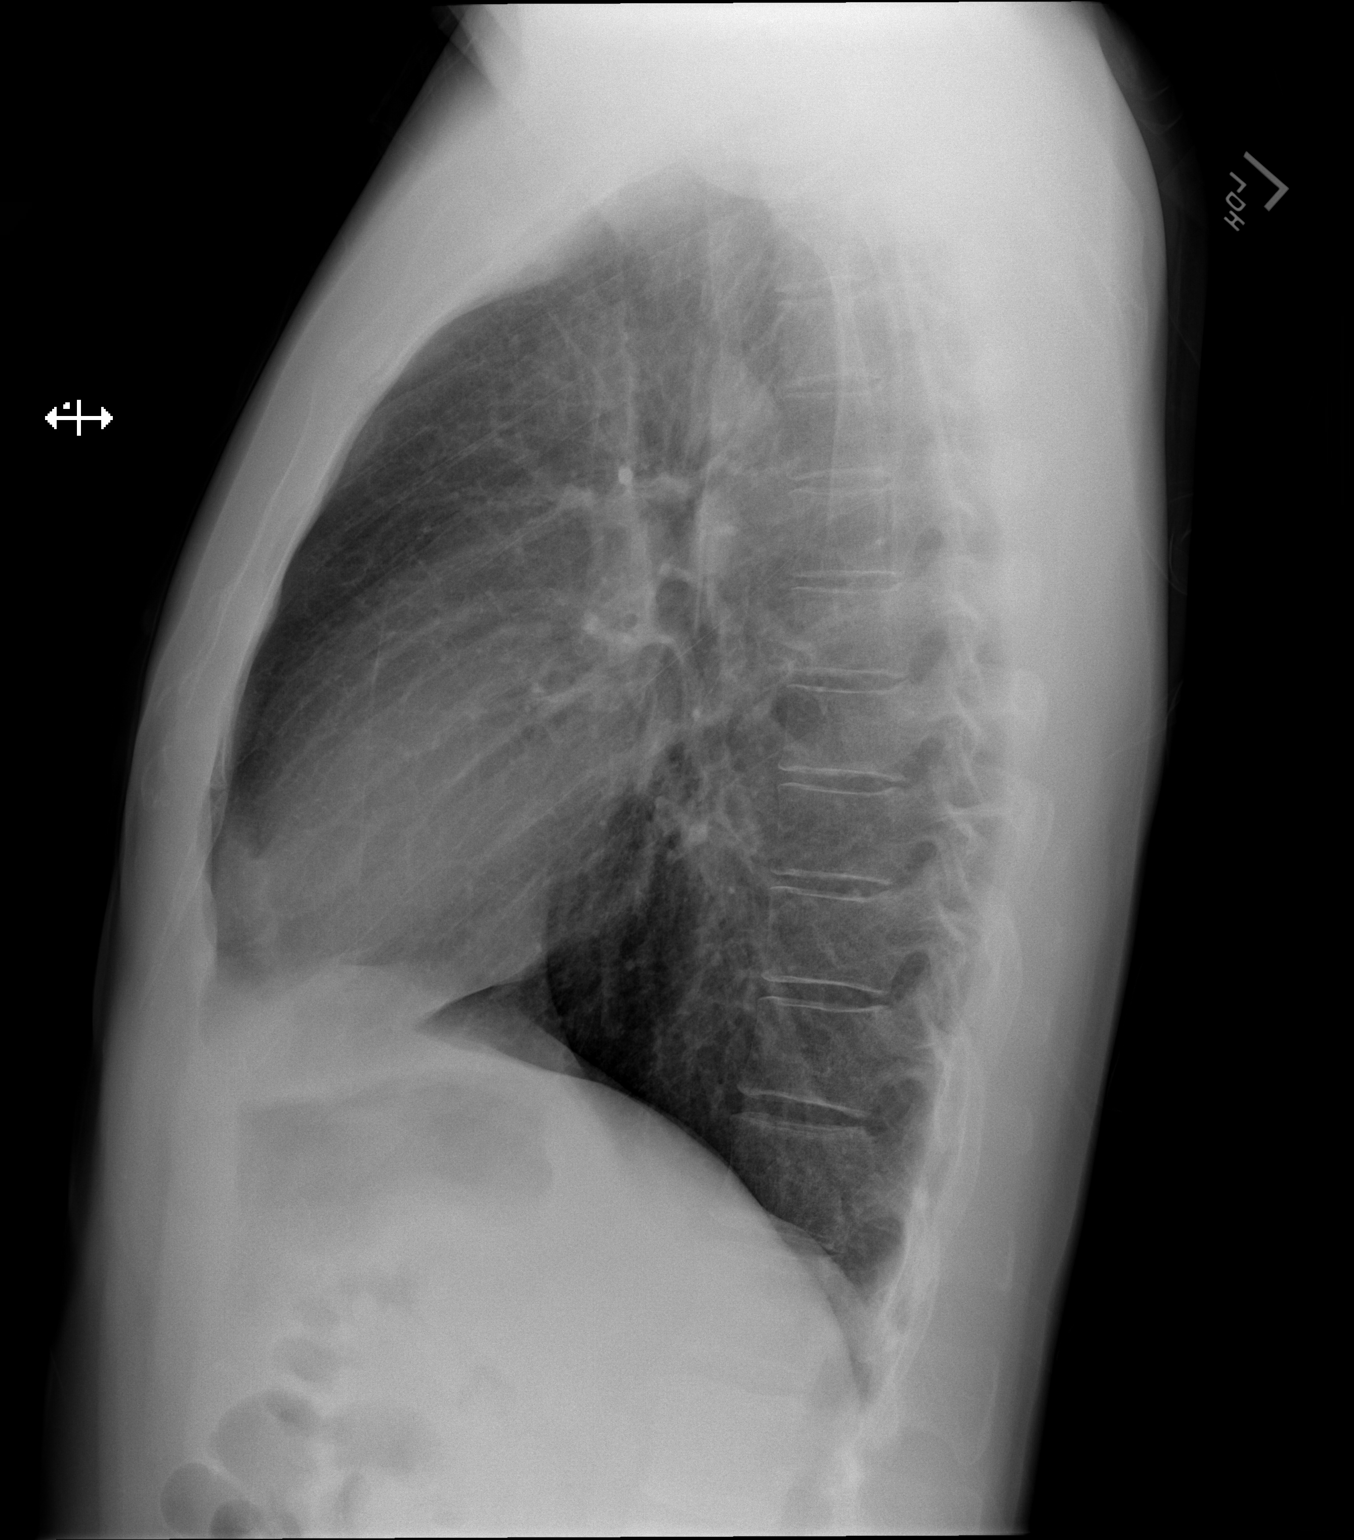

[2 of 2 positions shown; findings below may reference images not displayed]

FINDINGS: The tiny left apical pneumothorax has completely resolved. There is
stable biapical pleural thickening. Subsegmental atelectasis in the
right upper lobe has cleared. Subcutaneous emphysema on the right
has also cleared. The left lung is clear. The heart and pulmonary
vascularity are normal. There is no pleural effusion. The bony
thorax is unremarkable.
IMPRESSION: There is no residual pneumothorax. There has been interval
resolution of right upper lobe atelectasis. There is no active
cardiopulmonary disease.
